# Patient Record
Sex: Female | Born: 1975 | Hispanic: No | Marital: Married
Health system: Southern US, Community
[De-identification: ages and names within clinical notes are randomized; demographics above are authoritative.]

## PROBLEM LIST (undated history)

## (undated) DIAGNOSIS — E079 Disorder of thyroid, unspecified: Secondary | ICD-10-CM

## (undated) DIAGNOSIS — E119 Type 2 diabetes mellitus without complications: Secondary | ICD-10-CM

## (undated) HISTORY — DX: Disorder of thyroid, unspecified: E07.9

## (undated) HISTORY — PX: OTHER SURGICAL HISTORY: SHX169

## (undated) HISTORY — PX: CHOLECYSTECTOMY: SHX55

---

## 2009-05-12 HISTORY — PX: HYSTERECTOMY: SHX81

## 2013-03-08 ENCOUNTER — Other Ambulatory Visit (INDEPENDENT_AMBULATORY_CARE_PROVIDER_SITE_OTHER): Payer: Self-pay | Admitting: Internal Medicine

## 2013-03-16 ENCOUNTER — Ambulatory Visit (INDEPENDENT_AMBULATORY_CARE_PROVIDER_SITE_OTHER)
Admission: RE | Admit: 2013-03-16 | Discharge: 2013-03-16 | Disposition: A | Discharge status: Home / Self Care | Payer: Enrolled Prime—HMO | Source: Ambulatory Visit | Attending: Internal Medicine | Admitting: Internal Medicine

## 2013-03-17 ENCOUNTER — Other Ambulatory Visit: Payer: Self-pay | Admitting: Internal Medicine

## 2013-03-18 ENCOUNTER — Ambulatory Visit (INDEPENDENT_AMBULATORY_CARE_PROVIDER_SITE_OTHER): Payer: Self-pay

## 2013-03-23 ENCOUNTER — Ambulatory Visit
Admission: RE | Admit: 2013-03-23 | Discharge: 2013-03-23 | Disposition: A | Discharge status: Home / Self Care | Payer: Enrolled Prime—HMO | Source: Ambulatory Visit | Attending: Diagnostic Radiology | Admitting: Diagnostic Radiology

## 2013-03-23 ENCOUNTER — Ambulatory Visit
Admission: RE | Admit: 2013-03-23 | Discharge: 2013-03-23 | Disposition: A | Discharge status: Home / Self Care | Payer: Enrolled Prime—HMO | Source: Ambulatory Visit | Attending: Internal Medicine | Admitting: Internal Medicine

## 2013-03-23 ENCOUNTER — Other Ambulatory Visit: Payer: Self-pay | Admitting: Diagnostic Radiology

## 2013-03-23 DIAGNOSIS — E041 Nontoxic single thyroid nodule: Secondary | ICD-10-CM | POA: Insufficient documentation

## 2013-03-23 NOTE — Discharge Instructions (Signed)
Thyroid Biopsy Discharge Instructions        You may remove band-aid or dressing from biopsy site in 24 hrs.  Leave open to air.  May shower, clean with soap and water.      Monitor site daily for any redness, drainage, bleeding, irritation, signs of infection, or if you have a fever above 101 F seek medical attention.      You may have minimal pain or discomfort at the site.  If you are able to take Tylenol, take as indicated by the instructions. You may also apply cold compresses to area as needed.      If you experience significant pain, please contact your primary care physician, or seek medical attention.      You may have an odd "sensation" when swallowing.  This is also likely related to the procedure, due to the local anesthetic/numbing agent used.  This will wear off  in a few hours.      If you have difficulty swallowing, feel as if food/water will not go down, or if your throat feels tight in any way, this is a serious (and very rare) complication and will require you to immediately call 911.    If you have further questions, please call [phone].

## 2013-12-28 ENCOUNTER — Other Ambulatory Visit (INDEPENDENT_AMBULATORY_CARE_PROVIDER_SITE_OTHER): Payer: Self-pay | Admitting: Internal Medicine

## 2013-12-28 DIAGNOSIS — E041 Nontoxic single thyroid nodule: Secondary | ICD-10-CM

## 2014-01-06 ENCOUNTER — Other Ambulatory Visit (INDEPENDENT_AMBULATORY_CARE_PROVIDER_SITE_OTHER): Payer: Enrolled Prime—HMO

## 2014-01-11 ENCOUNTER — Ambulatory Visit (INDEPENDENT_AMBULATORY_CARE_PROVIDER_SITE_OTHER)
Admission: RE | Admit: 2014-01-11 | Discharge: 2014-01-11 | Disposition: A | Discharge status: Home / Self Care | Payer: Enrolled Prime—HMO | Source: Ambulatory Visit | Attending: Internal Medicine | Admitting: Internal Medicine

## 2014-01-11 DIAGNOSIS — E041 Nontoxic single thyroid nodule: Secondary | ICD-10-CM

## 2017-10-20 ENCOUNTER — Encounter: Payer: Self-pay | Admitting: Internal Medicine

## 2017-10-20 DIAGNOSIS — E039 Hypothyroidism, unspecified: Secondary | ICD-10-CM

## 2017-10-20 DIAGNOSIS — E042 Nontoxic multinodular goiter: Secondary | ICD-10-CM

## 2017-10-26 ENCOUNTER — Ambulatory Visit (INDEPENDENT_AMBULATORY_CARE_PROVIDER_SITE_OTHER)
Admission: RE | Admit: 2017-10-26 | Discharge: 2017-10-26 | Disposition: A | Discharge status: Home / Self Care | Source: Ambulatory Visit | Attending: Internal Medicine | Admitting: Internal Medicine

## 2017-10-26 DIAGNOSIS — E042 Nontoxic multinodular goiter: Secondary | ICD-10-CM

## 2017-10-26 DIAGNOSIS — E039 Hypothyroidism, unspecified: Secondary | ICD-10-CM

## 2018-08-05 DIAGNOSIS — Z1231 Encounter for screening mammogram for malignant neoplasm of breast: Secondary | ICD-10-CM

## 2018-09-09 ENCOUNTER — Other Ambulatory Visit: Payer: Self-pay

## 2018-09-09 ENCOUNTER — Encounter (HOSPITAL_COMMUNITY): Payer: Self-pay | Admitting: Emergency Medicine

## 2018-09-09 ENCOUNTER — Emergency Department (HOSPITAL_COMMUNITY)
Admission: EM | Admit: 2018-09-09 | Discharge: 2018-09-09 | Disposition: A | Discharge status: Home / Self Care | Payer: BLUE CROSS/BLUE SHIELD | Attending: Emergency Medicine | Admitting: Emergency Medicine

## 2018-09-09 ENCOUNTER — Emergency Department (HOSPITAL_COMMUNITY): Payer: BLUE CROSS/BLUE SHIELD

## 2018-09-09 DIAGNOSIS — T07XXXA Unspecified multiple injuries, initial encounter: Secondary | ICD-10-CM

## 2018-09-09 DIAGNOSIS — M545 Low back pain, unspecified: Secondary | ICD-10-CM

## 2018-09-09 DIAGNOSIS — S39012A Strain of muscle, fascia and tendon of lower back, initial encounter: Secondary | ICD-10-CM

## 2018-09-09 DIAGNOSIS — S161XXA Strain of muscle, fascia and tendon at neck level, initial encounter: Secondary | ICD-10-CM | POA: Diagnosis not present

## 2018-09-09 DIAGNOSIS — Y999 Unspecified external cause status: Secondary | ICD-10-CM | POA: Insufficient documentation

## 2018-09-09 DIAGNOSIS — Y939 Activity, unspecified: Secondary | ICD-10-CM | POA: Diagnosis not present

## 2018-09-09 DIAGNOSIS — S60222A Contusion of left hand, initial encounter: Secondary | ICD-10-CM | POA: Diagnosis not present

## 2018-09-09 DIAGNOSIS — S60221A Contusion of right hand, initial encounter: Secondary | ICD-10-CM | POA: Insufficient documentation

## 2018-09-09 DIAGNOSIS — E119 Type 2 diabetes mellitus without complications: Secondary | ICD-10-CM | POA: Diagnosis not present

## 2018-09-09 DIAGNOSIS — Y92410 Unspecified street and highway as the place of occurrence of the external cause: Secondary | ICD-10-CM | POA: Diagnosis not present

## 2018-09-09 DIAGNOSIS — S199XXA Unspecified injury of neck, initial encounter: Secondary | ICD-10-CM | POA: Diagnosis present

## 2018-09-09 HISTORY — DX: Type 2 diabetes mellitus without complications: E11.9

## 2018-09-09 MED ORDER — METHOCARBAMOL 500 MG PO TABS
500.0000 mg | ORAL_TABLET | Freq: Once | ORAL | Status: AC
  Administered 2018-09-09: 500 mg via ORAL
  Filled 2018-09-09: qty 1

## 2018-09-09 MED ORDER — OXYCODONE-ACETAMINOPHEN 5-325 MG PO TABS: 1.0000 | ORAL_TABLET | Freq: Three times a day (TID) | ORAL | 0 refills | Status: AC | PRN

## 2018-09-09 MED ORDER — IBUPROFEN 600 MG PO TABS: 600.0000 mg | ORAL_TABLET | Freq: Four times a day (QID) | ORAL | 0 refills | Status: AC | PRN

## 2018-09-09 MED ORDER — ACETAMINOPHEN ER 650 MG PO TBCR: 650.0000 mg | EXTENDED_RELEASE_TABLET | Freq: Three times a day (TID) | ORAL | 0 refills | Status: AC

## 2018-09-09 MED ORDER — OXYCODONE-ACETAMINOPHEN 5-325 MG PO TABS
1.0000 | ORAL_TABLET | Freq: Once | ORAL | Status: AC
  Administered 2018-09-09: 1 via ORAL
  Filled 2018-09-09: qty 1

## 2018-09-09 MED ORDER — OXYCODONE-ACETAMINOPHEN 5-325 MG PO TABS
1.0000 | ORAL_TABLET | Freq: Once | ORAL | Status: AC
  Administered 2018-09-09: 14:00:00 1 via ORAL
  Filled 2018-09-09: qty 1

## 2018-09-09 MED ORDER — METHOCARBAMOL 500 MG PO TABS: 500.0000 mg | ORAL_TABLET | Freq: Two times a day (BID) | ORAL | 0 refills | Status: AC

## 2018-09-09 MED ORDER — NAPROXEN 250 MG PO TABS
500.0000 mg | ORAL_TABLET | Freq: Once | ORAL | Status: AC
  Administered 2018-09-09: 14:00:00 500 mg via ORAL
  Filled 2018-09-09: qty 2
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
## 2018-09-09 NOTE — ED Notes (Signed)
ED Provider at bedside. 

## 2018-09-09 NOTE — ED Notes (Signed)
Blood sugar 131 per external device per pt.
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
## 2018-11-25 LAB — NOVEL CORONAVIRUS, NAA: SARS-CoV-2, NAA: NOT DETECTED

## 2018-11-29 ENCOUNTER — Encounter: Payer: Self-pay | Admitting: Hematology

## 2019-04-11 ENCOUNTER — Encounter: Payer: Self-pay | Admitting: Physician Assistant

## 2019-04-11 DIAGNOSIS — E041 Nontoxic single thyroid nodule: Secondary | ICD-10-CM

## 2019-04-14 ENCOUNTER — Ambulatory Visit
Admission: RE | Admit: 2019-04-14 | Discharge: 2019-04-14 | Disposition: A | Discharge status: Home / Self Care | Payer: TRICARE Prime—HMO | Source: Ambulatory Visit | Attending: Physician Assistant | Admitting: Physician Assistant

## 2019-04-14 DIAGNOSIS — E041 Nontoxic single thyroid nodule: Secondary | ICD-10-CM

## 2019-04-14 DIAGNOSIS — E042 Nontoxic multinodular goiter: Secondary | ICD-10-CM | POA: Insufficient documentation

## 2019-08-10 ENCOUNTER — Encounter: Payer: Self-pay | Admitting: Obstetrics & Gynecology

## 2019-08-10 DIAGNOSIS — Z1231 Encounter for screening mammogram for malignant neoplasm of breast: Secondary | ICD-10-CM

## 2019-08-24 ENCOUNTER — Ambulatory Visit: Payer: TRICARE Prime—HMO
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
## 2021-05-10 ENCOUNTER — Emergency Department
Admission: EM | Admit: 2021-05-10 | Discharge: 2021-05-10 | Disposition: A | Discharge status: Home / Self Care | Payer: TRICARE Prime—HMO | Attending: Emergency Medicine | Admitting: Emergency Medicine

## 2021-05-10 DIAGNOSIS — R450 Nervousness: Secondary | ICD-10-CM | POA: Insufficient documentation

## 2021-05-10 DIAGNOSIS — R112 Nausea with vomiting, unspecified: Secondary | ICD-10-CM | POA: Insufficient documentation

## 2021-05-10 DIAGNOSIS — R443 Hallucinations, unspecified: Secondary | ICD-10-CM | POA: Insufficient documentation

## 2021-05-10 DIAGNOSIS — F12922 Cannabis use, unspecified with intoxication with perceptual disturbance: Secondary | ICD-10-CM | POA: Insufficient documentation

## 2021-05-10 DIAGNOSIS — E079 Disorder of thyroid, unspecified: Secondary | ICD-10-CM | POA: Insufficient documentation

## 2021-05-10 LAB — ECG 12-LEAD
Interpretation Text: NORMAL
P Wave Axis: 44 deg
P-R Interval: 169 ms
Patient Age: 45 years
Q-T Interval(Corrected): 443 ms
Q-T Interval: 345 ms
QRS Axis: 14 deg
QRS Duration: 88 ms
T Axis: 20 years
Ventricular Rate: 99 //min

## 2021-05-10 LAB — HEPATIC FUNCTION PANEL
ALT: 31 U/L (ref 0–55)
AST (SGOT): 34 U/L (ref 10–42)
Albumin/Globulin Ratio: 1.29 Ratio (ref 0.80–2.00)
Albumin: 4 gm/dL (ref 3.5–5.0)
Alkaline Phosphatase: 72 U/L (ref 40–145)
Bilirubin Direct: <0.1 mg/dL (ref 0.0–0.3)
Bilirubin, Total: 0.2 mg/dL (ref 0.1–1.2)
Globulin: 3.1 gm/dL (ref 2.0–4.0)
Protein, Total: 7.1 gm/dL (ref 6.0–8.3)

## 2021-05-10 LAB — CBC AND DIFFERENTIAL
Basophils %: 0.6 % (ref 0.0–3.0)
Basophils Absolute: 0.1 10*3/uL (ref 0.0–0.3)
Eosinophils %: 3 % (ref 0.0–7.0)
Eosinophils Absolute: 0.4 10*3/uL (ref 0.0–0.8)
Hematocrit: 40.7 % (ref 36.0–48.0)
Hemoglobin: 12.9 gm/dL (ref 12.0–16.0)
Lymphocytes Absolute: 3.6 10*3/uL (ref 0.6–5.1)
Lymphocytes: 27.8 % (ref 15.0–46.0)
MCH: 30 pg (ref 28–35)
MCHC: 32 gm/dL (ref 32–36)
MCV: 93 fL (ref 80–100)
MPV: 7.7 fL (ref 6.0–10.0)
Monocytes Absolute: 0.8 10*3/uL (ref 0.1–1.7)
Monocytes: 6 % (ref 3.0–15.0)
Neutrophils %: 62.6 % (ref 42.0–78.0)
Neutrophils Absolute: 8 10*3/uL (ref 1.7–8.6)
PLT CT: 373 10*3/uL (ref 130–440)
RBC: 4.38 10*6/uL (ref 3.80–5.00)
RDW: 12.3 % (ref 11.0–14.0)
WBC: 12.8 10*3/uL — ABNORMAL HIGH (ref 4.0–11.0)

## 2021-05-10 LAB — BASIC METABOLIC PANEL
Anion Gap: 18.6 mMol/L — ABNORMAL HIGH (ref 7.0–18.0)
BUN / Creatinine Ratio: 18.9 Ratio (ref 10.0–30.0)
BUN: 18 mg/dL (ref 7–22)
CO2: 22 mMol/L (ref 20–30)
Calcium: 9.4 mg/dL (ref 8.5–10.5)
Chloride: 106 mMol/L (ref 98–110)
Creatinine: 0.95 mg/dL (ref 0.60–1.20)
EGFR: 75 mL/min/{1.73_m2} (ref 60–150)
Glucose: 149 mg/dL — ABNORMAL HIGH (ref 71–99)
Osmolality Calculated: 290 mOsm/kg (ref 275–300)
Potassium: 3.6 mMol/L (ref 3.5–5.3)
Sodium: 143 mMol/L (ref 136–147)

## 2021-05-10 LAB — VH EXTRA SPECIMEN LABEL

## 2021-05-10 MED ORDER — HALOPERIDOL LACTATE 5 MG/ML IJ SOLN
INTRAMUSCULAR | Status: AC
Start: 2021-05-10 — End: ?
  Filled 2021-05-10: qty 1

## 2021-05-10 MED ORDER — LORAZEPAM 2 MG/ML IJ SOLN
0.5000 mg | Freq: Once | INTRAMUSCULAR | Status: AC
Start: 2021-05-10 — End: 2021-05-10
  Administered 2021-05-10: 03:00:00 0.5 mg via INTRAVENOUS

## 2021-05-10 MED ORDER — ONDANSETRON HCL 4 MG/2ML IJ SOLN
4.0000 mg | Freq: Once | INTRAMUSCULAR | Status: AC
Start: 2021-05-10 — End: 2021-05-10
  Administered 2021-05-10: 02:00:00 4 mg via INTRAVENOUS

## 2021-05-10 MED ORDER — HALOPERIDOL LACTATE 5 MG/ML IJ SOLN
2.0000 mg | Freq: Once | INTRAMUSCULAR | Status: AC
Start: 2021-05-10 — End: 2021-05-10
  Administered 2021-05-10: 03:00:00 2 mg via INTRAVENOUS

## 2021-05-10 MED ORDER — VH SODIUM CHLORIDE 0.9 % IV BOLUS
1000.0000 mL | Freq: Once | INTRAVENOUS | Status: AC
Start: 2021-05-10 — End: 2021-05-10
  Administered 2021-05-10: 02:00:00 1000 mL via INTRAVENOUS

## 2021-05-10 MED ORDER — ONDANSETRON HCL 4 MG/2ML IJ SOLN
INTRAMUSCULAR | Status: AC
Start: 2021-05-10 — End: ?
  Filled 2021-05-10: qty 2

## 2021-05-10 MED ORDER — LORAZEPAM 2 MG/ML IJ SOLN
0.5000 mg | Freq: Once | INTRAMUSCULAR | Status: AC
Start: 2021-05-10 — End: 2021-05-10
  Administered 2021-05-10: 02:00:00 0.5 mg via INTRAVENOUS

## 2021-05-10 MED ORDER — LORAZEPAM 2 MG/ML IJ SOLN
INTRAMUSCULAR | Status: AC
Start: 2021-05-10 — End: ?
  Filled 2021-05-10: qty 1

## 2021-05-10 NOTE — ED Provider Notes (Signed)
History     Chief Complaint   Patient presents with    Drug Overdose     The history is provided by the patient, the spouse and the EMS personnel. No language interpreter was used.   Drug Overdose  Associated symptoms include nausea and vomiting. Pertinent negatives include no abdominal pain, arthralgias, chest pain, chills, fever, myalgias, rash, sore throat or weakness.    Patient is a 45 year old female with thyroid dysfunction otherwise fairly healthy who presents accompanied by her husband and daughter after being brought in by EMS for extreme anxiety after taking marijuana derivative edibles.    Per report from EMS with collateral from patient's husband, she took 2 THC derivative Gummies tonight.  Husband reported 75 mg of delta 8.  Patient has had Gummies before but not this brand.  Intake was around 11 PM.  After about an hour she started becoming excessively anxious, moaning, husband was concerned about psychosis, called EMS.    With EMS excessively anxious and writhing on stretcher, crying and screaming, vomited on arrival, brought back to the room for assessment as she was disturbing other patients in the waiting room.    Beyond this patient unable to contribute to history herself.  Moaning excessively, eyes wide open.    Past Medical History:   Diagnosis Date    Disorder of thyroid     hashimoto's       Past Surgical History:   Procedure Laterality Date    HYSTERECTOMY  2011       History reviewed. No pertinent family history.    Social  Social History     Tobacco Use    Smoking status: Unknown       .     No Known Allergies    Home Medications       Med List Status: Unable to Assess Set By: Lance Muss, RN at 05/10/2021  2:23 AM              Cholecalciferol ([employer] VIT D 5000 HIGH-POTENCY PO)     Take 1 capsule by mouth daily.     levothyroxine (SYNTHROID, LEVOTHROID) 125 MCG tablet     Take 125 mcg by mouth Once a day at 6:00am.     Omega-3 Fatty Acids (OMEGA 3 PO)     Take 1 capsule by mouth  daily.     Probiotic Product (PROBIOTIC DAILY PO)     Take 1 capsule by mouth daily.             Review of Systems   Constitutional:  Negative for chills and fever.   HENT:  Negative for facial swelling, sore throat and trouble swallowing.    Respiratory:  Negative for chest tightness and shortness of breath.    Cardiovascular:  Negative for chest pain and palpitations.   Gastrointestinal:  Positive for nausea and vomiting. Negative for abdominal pain and diarrhea.   Genitourinary:  Negative for dysuria and hematuria.   Musculoskeletal:  Negative for arthralgias and myalgias.   Skin:  Negative for rash and wound.   Neurological:  Negative for seizures, syncope and weakness.   Psychiatric/Behavioral:  Positive for dysphoric mood, hallucinations and sleep disturbance. Negative for confusion and suicidal ideas. The patient is nervous/anxious.    All other systems reviewed and are negative.    Physical Exam    BP: (!) 140/110, Heart Rate: (!) 117, Temp: 97.4 F (36.3 C), Resp Rate: 20, SpO2: 98 %    Physical Exam  Vitals and nursing note reviewed.   Constitutional:       General: She is not in acute distress.     Appearance: Normal appearance.      Comments: Very anxious woman resting on stretcher, moaning, holding vomitus bag   HENT:      Head: Normocephalic and atraumatic.      Mouth/Throat:      Mouth: Mucous membranes are dry.   Eyes:      General: No scleral icterus.        Right eye: No discharge.         Left eye: No discharge.      Conjunctiva/sclera: Conjunctivae normal.      Pupils: Pupils are equal, round, and reactive to light.      Comments: Pupils 3 mm, round reactive   Cardiovascular:      Rate and Rhythm: Normal rate and regular rhythm.      Heart sounds: Normal heart sounds. No murmur heard.  Pulmonary:      Effort: Pulmonary effort is normal. No respiratory distress.      Breath sounds: Normal breath sounds.   Abdominal:      General: Abdomen is flat. There is no distension.      Palpations: Abdomen  is soft.      Tenderness: There is no abdominal tenderness.   Skin:     General: Skin is warm and dry.      Coloration: Skin is not jaundiced.      Findings: No rash.   Neurological:      General: No focal deficit present.      Mental Status: She is alert. She is disoriented.      Coordination: Coordination normal.   Psychiatric:         Attention and Perception: She is inattentive.         Mood and Affect: Mood is anxious and elated.         Speech: She is noncommunicative.         Behavior: Behavior is agitated.         MDM and ED Course     ED Medication Orders (From admission, onward)      Start Ordered     Status Ordering Provider    05/10/21 0246 05/10/21 0245  LORazepam (ATIVAN) injection 0.5 mg  Once in ED        Route: Intravenous  Ordered Dose: 0.5 mg     Last MAR action: Given Kiri Hinderliter F    05/10/21 0246 05/10/21 0245  haloperidol lactate (HALDOL) injection 2 mg  Once in ED        Route: Intravenous  Ordered Dose: 2 mg     Last MAR action: Given Katiria Calame F    05/10/21 0206 05/10/21 0205  sodium chloride 0.9 % bolus 1,000 mL  Once in ED        Route: Intravenous  Ordered Dose: 1,000 mL     Last MAR action: Stopped Nyemah Watton F    05/10/21 0206 05/10/21 0205  ondansetron (ZOFRAN) injection 4 mg  Once in ED        Route: Intravenous  Ordered Dose: 4 mg     Last MAR action: Given Patra Gherardi F    05/10/21 0206 05/10/21 0205  LORazepam (ATIVAN) injection 0.5 mg  Once in ED        Route: Intravenous  Ordered Dose: 0.5 mg     Last MAR action: Given Aisia Correira,  Jayleene Glaeser F               MDM    Assessment:  Patient seen and evaluated. On initial presentation vitals were notable for tachycardia, HTN. Exam was significant for anxious and agitated. Differential diagnosis at this point includes but is not limited to marijuana derivative intoxication with behavioral and psychiatric disturbance, nothing in particular suggest a more acute life-threatening process, husband is reliable and does not endorse  ingestion of other substances.  Low risk of significant harm from this, we will focus on symptomatic management and assessment as below.    Plan:  Orders Placed This Encounter   Procedures    Collect Blood    CBC and differential    Basic Metabolic Panel    Hepatic function panel (LFT)    Cardiac Monitoring (Hard Wire)    Pulse Oximetry    ECG 12 lead (Stat) (Cardiac Related)    Saline lock IV       Medications   sodium chloride 0.9 % bolus 1,000 mL (0 mLs Intravenous Stopped 05/10/21 0318)   ondansetron (ZOFRAN) injection 4 mg (4 mg Intravenous Given 05/10/21 0216)   LORazepam (ATIVAN) injection 0.5 mg (0.5 mg Intravenous Given 05/10/21 0218)   LORazepam (ATIVAN) injection 0.5 mg (0.5 mg Intravenous Given 05/10/21 0325)   haloperidol lactate (HALDOL) injection 2 mg (2 mg Intravenous Given 05/10/21 0324)       Updates and Results:    EKG Interpretation:   NORMAL SINUS RHYTHM rate 99  Normal axis  No ST or T change for acute ischemia  Low voltage, precordial leads  QTc 443  No previous ECG available for comparison  Electronically Signed On 05-10-2021 3:58:23 EST by Tenny Craw    Labs:  Labs Reviewed   CBC AND DIFFERENTIAL - Abnormal; Notable for the following components:       Result Value    WBC 12.8 (*)     All other components within normal limits   BASIC METABOLIC PANEL - Abnormal; Notable for the following components:    Glucose 149 (*)     Anion Gap 18.6 (*)     All other components within normal limits   VH EXTRA SPECIMEN LABEL   HEPATIC FUNCTION PANEL       Imaging:  No results found.     The cardiac monitor revealed sinus tachycardic rhythm as interpreted by me.  The cardiac monitor was ordered secondary to the patient's history of anxiety and tachycardia and to monitor the patient for dysrhythmia or significant tachycardia.  Their rate was 105 and rhythm was sinus tach.      ED Course as of 05/10/21 0512   Fri May 10, 2021   0247 Reassessed.  Patient still quite distraught after initial dose of Ativan  and Zofran.  Discussed with husband bedside neck steps, will redose Ativan, and 2 mg of Haldol. [DG]   0340 Reassessed.  Much more comfortable.  Heart rate normalized to the 80s.  Resting comfortably.  Lab results so far reassuring.  Discussed with husband plan.  Will give some more time, but anticipate discharge shortly to rest up at home and recover. [DG]   [id number] Reassessed again.  Sleeping comfortably.  Heart rate in the 70s.  Work-up reassuring.  Discussed with husband disposition.  He is comfortable at this point taking her home to continue to sleep things off.  Preparing discharge. [DG]      ED Course User Index  [DG] Eulogio Bear,  MD       Procedures    Clinical Impression & Disposition     Clinical Impression  Final diagnoses:   Marijuana intoxication, with perceptual disturbance        ED Disposition       ED Disposition   Discharge    Condition   --    Date/Time   Fri May 10, 2021  4:30 AM    Comment   Jameia Makris discharge to home/self care.    Condition at disposition: Stable                  Discharge Medication List as of 05/10/2021  4:31 AM           I had a detailed discussion with the patient/caregiver and any family present, and in our conversation, we have covered the likely causes of the patient's symptoms, expected course, treatment plan, need for follow-up, and specific reasons/examples for which the patient would need to return to the emergency department. They have verbalized understanding and are comfortable with the plan. All questions have been answered.  Discharge with PCP follow up and other care as described in the discharge instructions.               Eulogio Bear, MD  05/10/21 [phone]

## 2021-05-10 NOTE — ED Notes (Signed)
Patient having difficulty sitting in wheelchair, actively vomiting in triage. Pt taken to acute bed so that she can be medicated and monitored.

## 2021-10-15 ENCOUNTER — Encounter: Payer: Self-pay | Admitting: Physician Assistant

## 2021-10-15 DIAGNOSIS — K6289 Other specified diseases of anus and rectum: Secondary | ICD-10-CM

## 2021-10-15 DIAGNOSIS — R110 Nausea: Secondary | ICD-10-CM

## 2021-10-16 ENCOUNTER — Encounter: Payer: Self-pay | Admitting: Obstetrics & Gynecology

## 2021-10-16 DIAGNOSIS — Z1231 Encounter for screening mammogram for malignant neoplasm of breast: Secondary | ICD-10-CM

## 2021-11-05 ENCOUNTER — Ambulatory Visit (INDEPENDENT_AMBULATORY_CARE_PROVIDER_SITE_OTHER)
Admission: RE | Admit: 2021-11-05 | Discharge: 2021-11-05 | Disposition: A | Discharge status: Home / Self Care | Payer: TRICARE Prime—HMO | Source: Ambulatory Visit | Attending: Physician Assistant | Admitting: Physician Assistant

## 2021-11-05 DIAGNOSIS — R110 Nausea: Secondary | ICD-10-CM

## 2021-11-05 DIAGNOSIS — K6289 Other specified diseases of anus and rectum: Secondary | ICD-10-CM

## 2021-11-05 MED ORDER — IOHEXOL 350 MG/ML IV SOLN
85.0000 mL | Freq: Once | INTRAVENOUS | Status: AC | PRN
Start: 2021-11-05 — End: 2021-11-05
  Administered 2021-11-05: 85 mL via INTRAVENOUS

## 2021-11-18 ENCOUNTER — Encounter (INDEPENDENT_AMBULATORY_CARE_PROVIDER_SITE_OTHER): Payer: Self-pay | Admitting: Physician Assistant

## 2021-11-18 ENCOUNTER — Ambulatory Visit (INDEPENDENT_AMBULATORY_CARE_PROVIDER_SITE_OTHER)
Admission: RE | Admit: 2021-11-18 | Discharge: 2021-11-18 | Disposition: A | Discharge status: Home / Self Care | Payer: TRICARE Prime—HMO | Source: Ambulatory Visit | Attending: Physician Assistant | Admitting: Physician Assistant

## 2021-11-18 DIAGNOSIS — M533 Sacrococcygeal disorders, not elsewhere classified: Secondary | ICD-10-CM

## 2021-11-22 ENCOUNTER — Encounter: Payer: Self-pay | Admitting: Physician Assistant

## 2021-11-22 DIAGNOSIS — M533 Sacrococcygeal disorders, not elsewhere classified: Secondary | ICD-10-CM

## 2021-11-22 DIAGNOSIS — M5136 Other intervertebral disc degeneration, lumbar region: Secondary | ICD-10-CM

## 2021-12-18 ENCOUNTER — Ambulatory Visit (INDEPENDENT_AMBULATORY_CARE_PROVIDER_SITE_OTHER): Payer: TRICARE Prime—HMO

## 2021-12-18 ENCOUNTER — Encounter (INDEPENDENT_AMBULATORY_CARE_PROVIDER_SITE_OTHER): Payer: Self-pay

## 2021-12-23 ENCOUNTER — Encounter: Payer: Self-pay | Admitting: Physician Assistant

## 2021-12-25 ENCOUNTER — Encounter: Payer: Self-pay | Admitting: Physician Assistant

## 2021-12-25 DIAGNOSIS — K6289 Other specified diseases of anus and rectum: Secondary | ICD-10-CM

## 2022-01-12 ENCOUNTER — Encounter (INDEPENDENT_AMBULATORY_CARE_PROVIDER_SITE_OTHER): Payer: Self-pay

## 2022-01-12 ENCOUNTER — Ambulatory Visit (INDEPENDENT_AMBULATORY_CARE_PROVIDER_SITE_OTHER): Payer: TRICARE Prime—HMO

## 2022-03-04 ENCOUNTER — Ambulatory Visit (INDEPENDENT_AMBULATORY_CARE_PROVIDER_SITE_OTHER)
Admission: RE | Admit: 2022-03-04 | Discharge: 2022-03-04 | Disposition: A | Discharge status: Home / Self Care | Payer: TRICARE Prime—HMO | Source: Ambulatory Visit | Attending: Physician Assistant | Admitting: Physician Assistant

## 2022-03-04 DIAGNOSIS — M5136 Other intervertebral disc degeneration, lumbar region: Secondary | ICD-10-CM

## 2022-03-04 DIAGNOSIS — M533 Sacrococcygeal disorders, not elsewhere classified: Secondary | ICD-10-CM
